# Patient Record
Sex: Female | Born: 1975 | Race: Black or African American | Hispanic: No | Marital: Single | Smoking: Never smoker
Health system: Southern US, Community
[De-identification: ages and names within clinical notes are randomized; demographics above are authoritative.]

---

## 2000-06-26 ENCOUNTER — Emergency Department (HOSPITAL_COMMUNITY): Admission: EM | Admit: 2000-06-26 | Discharge: 2000-06-26 | Payer: Self-pay | Admitting: Emergency Medicine

## 2000-11-21 ENCOUNTER — Emergency Department (HOSPITAL_COMMUNITY): Admission: EM | Admit: 2000-11-21 | Discharge: 2000-11-21 | Payer: Self-pay | Admitting: Emergency Medicine

## 2000-11-28 ENCOUNTER — Emergency Department (HOSPITAL_COMMUNITY): Admission: EM | Admit: 2000-11-28 | Discharge: 2000-11-28 | Payer: Self-pay | Admitting: Emergency Medicine

## 2003-06-03 ENCOUNTER — Other Ambulatory Visit: Admission: RE | Admit: 2003-06-03 | Discharge: 2003-06-03 | Payer: Self-pay | Admitting: Obstetrics and Gynecology

## 2003-10-02 ENCOUNTER — Emergency Department (HOSPITAL_COMMUNITY): Admission: EM | Admit: 2003-10-02 | Discharge: 2003-10-02 | Payer: Self-pay | Admitting: Emergency Medicine

## 2008-08-06 ENCOUNTER — Encounter (INDEPENDENT_AMBULATORY_CARE_PROVIDER_SITE_OTHER): Payer: Self-pay | Admitting: *Deleted

## 2008-10-08 ENCOUNTER — Ambulatory Visit: Payer: Self-pay | Admitting: Family Medicine

## 2008-10-08 DIAGNOSIS — L509 Urticaria, unspecified: Secondary | ICD-10-CM | POA: Insufficient documentation

## 2008-10-08 DIAGNOSIS — R631 Polydipsia: Secondary | ICD-10-CM

## 2008-10-08 LAB — CONVERTED CEMR LAB
Bilirubin Urine: NEGATIVE
Glucose, Urine, Semiquant: NEGATIVE
Ketones, urine, test strip: NEGATIVE
Nitrite: NEGATIVE
Protein, U semiquant: NEGATIVE
Specific Gravity, Urine: 1.015
Urobilinogen, UA: 0.2
WBC Urine, dipstick: NEGATIVE
pH: 6.5

## 2008-10-11 ENCOUNTER — Encounter: Payer: Self-pay | Admitting: Family Medicine

## 2008-10-11 LAB — CONVERTED CEMR LAB
ALT: 17 units/L (ref 0–35)
AST: 29 units/L (ref 0–37)
Albumin: 3.8 g/dL (ref 3.5–5.2)
Alkaline Phosphatase: 42 units/L (ref 39–117)
BUN: 15 mg/dL (ref 6–23)
Basophils Absolute: 0.1 10*3/uL (ref 0.0–0.1)
Basophils Relative: 1 % (ref 0.0–3.0)
Bilirubin, Direct: 0 mg/dL (ref 0.0–0.3)
CO2: 29 meq/L (ref 19–32)
Calcium: 9.2 mg/dL (ref 8.4–10.5)
Chloride: 107 meq/L (ref 96–112)
Cholesterol: 194 mg/dL (ref 0–200)
Creatinine, Ser: 0.8 mg/dL (ref 0.4–1.2)
Eosinophils Absolute: 0.2 10*3/uL (ref 0.0–0.7)
Eosinophils Relative: 3.2 % (ref 0.0–5.0)
GFR calc non Af Amer: 106.21 mL/min (ref >60)
Glucose, Bld: 92 mg/dL (ref 70–99)
HCT: 39.1 % (ref 36.0–46.0)
HDL: 59.2 mg/dL (ref >39.00)
Hemoglobin: 13.1 g/dL (ref 12.0–15.0)
Hgb A1c MFr Bld: 5 % (ref 4.6–6.5)
LDL Cholesterol: 128 mg/dL — ABNORMAL HIGH (ref 0–99)
Lymphocytes Relative: 35.3 % (ref 12.0–46.0)
Lymphs Abs: 2.1 10*3/uL (ref 0.7–4.0)
MCHC: 33.4 g/dL (ref 30.0–36.0)
MCV: 87.4 fL (ref 78.0–100.0)
Monocytes Absolute: 0.6 10*3/uL (ref 0.1–1.0)
Monocytes Relative: 9.6 % (ref 3.0–12.0)
Neutro Abs: 2.9 10*3/uL (ref 1.4–7.7)
Neutrophils Relative %: 50.9 % (ref 43.0–77.0)
Platelets: 257 10*3/uL (ref 150.0–400.0)
Potassium: 4.1 meq/L (ref 3.5–5.1)
RBC: 4.47 M/uL (ref 3.87–5.11)
RDW: 12.3 % (ref 11.5–14.6)
Sodium: 139 meq/L (ref 135–145)
TSH: 2.22 microintl units/mL (ref 0.35–5.50)
Total Bilirubin: 1.1 mg/dL (ref 0.3–1.2)
Total CHOL/HDL Ratio: 3
Total Protein: 7.6 g/dL (ref 6.0–8.3)
Triglycerides: 33 mg/dL (ref 0.0–149.0)
VLDL: 6.6 mg/dL (ref 0.0–40.0)
WBC: 5.9 10*3/uL (ref 4.5–10.5)

## 2008-10-15 ENCOUNTER — Encounter: Payer: Self-pay | Admitting: Family Medicine

## 2008-10-15 LAB — CONVERTED CEMR LAB: IgE (Immunoglobulin E), Serum: 47.8 intl units/mL (ref 0.0–180.0)

## 2011-09-14 ENCOUNTER — Emergency Department (HOSPITAL_COMMUNITY)
Admission: EM | Admit: 2011-09-14 | Discharge: 2011-09-15 | Disposition: A | Discharge status: Home / Self Care | Payer: 59 | Attending: Emergency Medicine | Admitting: Emergency Medicine

## 2011-09-14 ENCOUNTER — Encounter (HOSPITAL_COMMUNITY): Payer: Self-pay | Admitting: *Deleted

## 2011-09-14 DIAGNOSIS — Z882 Allergy status to sulfonamides status: Secondary | ICD-10-CM | POA: Insufficient documentation

## 2011-09-14 DIAGNOSIS — I10 Essential (primary) hypertension: Secondary | ICD-10-CM | POA: Insufficient documentation

## 2011-09-14 LAB — URINALYSIS, ROUTINE W REFLEX MICROSCOPIC
Bilirubin Urine: NEGATIVE
Glucose, UA: NEGATIVE mg/dL
Hgb urine dipstick: NEGATIVE
Ketones, ur: NEGATIVE mg/dL
Leukocytes, UA: NEGATIVE
Nitrite: NEGATIVE
Protein, ur: NEGATIVE mg/dL
Specific Gravity, Urine: 1.014 (ref 1.005–1.030)
Urobilinogen, UA: 1 mg/dL (ref 0.0–1.0)
pH: 6 (ref 5.0–8.0)

## 2011-09-14 LAB — PREGNANCY, URINE: Preg Test, Ur: NEGATIVE

## 2011-09-14 NOTE — ED Notes (Signed)
The pt reports that she was seen and worked up for leg pain at [facility] one weeks ago for leg pain and was given a lovenox shot  And had doppler studies.  Neg for dvt.

## 2011-09-14 NOTE — ED Notes (Signed)
The pt is c/o high bp and tingling in her arms for one week.  She is on no bp meds

## 2011-09-15 LAB — POCT I-STAT, CHEM 8
BUN: 13 mg/dL (ref 6–23)
Chloride: 105 mEq/L (ref 96–112)
Creatinine, Ser: 1 mg/dL (ref 0.50–1.10)
Potassium: 3.6 mEq/L (ref 3.5–5.1)
Sodium: 140 mEq/L (ref 135–145)
TCO2: 22 mmol/L (ref 0–100)

## 2011-09-15 MED ORDER — LISINOPRIL 20 MG PO TABS: 10.0000 mg | ORAL_TABLET | Freq: Every day | ORAL | Status: DC

## 2011-09-15 NOTE — ED Provider Notes (Addendum)
History     CSN: [id number]  Arrival date & time 09/14/11  2250   First MD Initiated Contact with Patient 09/15/11 0005      Chief Complaint  Patient presents with  . Hypertension    (Consider location/radiation/quality/duration/timing/severity/associated sxs/prior treatment) Patient is a 36 y.o. female presenting with hypertension. The history is provided by the patient.  Hypertension This is a new problem. The current episode started 2 days ago. The problem occurs constantly. The problem has not changed since onset.Associated symptoms comments: bilat arm tingling. Nothing relieves the symptoms. She has tried nothing for the symptoms.    History reviewed. No pertinent past medical history.  History reviewed. No pertinent past surgical history.  No family history on file.  History  Substance Use Topics  . Smoking status: Never Smoker   . Smokeless tobacco: Not on file  . Alcohol Use: Yes    OB History    Grav Para Term Preterm Abortions TAB SAB Ect Mult Living                  Review of Systems  Neurological: Positive for numbness.  All other systems reviewed and are negative.    Allergies  Sulfonamide derivatives  Home Medications  No current outpatient prescriptions on file.  BP 159/94  Pulse 76  Temp 98.8 F (37.1 C) (Oral)  Resp 21  SpO2 100%  LMP 08/24/2011  Physical Exam  Constitutional: She is oriented to person, place, and time. She appears well-developed and well-nourished.  HENT:  Head: Normocephalic and atraumatic.  Eyes: Conjunctivae and EOM are normal. Pupils are equal, round, and reactive to light.  Neck: Normal range of motion.  Cardiovascular: Normal rate, regular rhythm and normal heart sounds.   Pulmonary/Chest: Effort normal and breath sounds normal.  Abdominal: Soft. Bowel sounds are normal.  Musculoskeletal: Normal range of motion.  Neurological: She is alert and oriented to person, place, and time.  Skin: Skin is warm and  dry.  Psychiatric: She has a normal mood and affect. Her behavior is normal.    ED Course  Procedures (including critical care time)   Labs Reviewed  PREGNANCY, URINE  URINALYSIS, ROUTINE W REFLEX MICROSCOPIC   No results found.   No diagnosis found.    MDM  Mild hypertension, paraesthesia without focality.  No chest pain.  No headache. NV intact.  Will check chemistry,  Antihypertensive.    BP improved.  sxs resolved.  Will dc to [facility] out[facility],  Ret new/worsening sxs      Kaylamarie Swickard Lytle Michaels, MD 09/15/11 [id number]  Rosanne Ashing, MD 09/15/11 1191

## 2012-08-23 ENCOUNTER — Emergency Department (HOSPITAL_COMMUNITY)
Admission: EM | Admit: 2012-08-23 | Discharge: 2012-08-23 | Disposition: A | Discharge status: Home / Self Care | Payer: 59 | Attending: Emergency Medicine | Admitting: Emergency Medicine

## 2012-08-23 ENCOUNTER — Encounter (HOSPITAL_COMMUNITY): Payer: Self-pay | Admitting: *Deleted

## 2012-08-23 DIAGNOSIS — M2550 Pain in unspecified joint: Secondary | ICD-10-CM

## 2012-08-23 DIAGNOSIS — R21 Rash and other nonspecific skin eruption: Secondary | ICD-10-CM | POA: Insufficient documentation

## 2012-08-23 DIAGNOSIS — R609 Edema, unspecified: Secondary | ICD-10-CM | POA: Insufficient documentation

## 2012-08-23 DIAGNOSIS — L539 Erythematous condition, unspecified: Secondary | ICD-10-CM | POA: Insufficient documentation

## 2012-08-23 DIAGNOSIS — Z91040 Latex allergy status: Secondary | ICD-10-CM | POA: Insufficient documentation

## 2012-08-23 DIAGNOSIS — Z79899 Other long term (current) drug therapy: Secondary | ICD-10-CM | POA: Insufficient documentation

## 2012-08-23 LAB — CBC WITH DIFFERENTIAL/PLATELET
Eosinophils Absolute: 0.4 10*3/uL (ref 0.0–0.7)
Eosinophils Relative: 7 % — ABNORMAL HIGH (ref 0–5)
HCT: 40.6 % (ref 36.0–46.0)
Hemoglobin: 13.7 g/dL (ref 12.0–15.0)
Lymphocytes Relative: 33 % (ref 12–46)
Lymphs Abs: 1.9 10*3/uL (ref 0.7–4.0)
MCH: 28.7 pg (ref 26.0–34.0)
MCV: 85.1 fL (ref 78.0–100.0)
Monocytes Absolute: 0.6 10*3/uL (ref 0.1–1.0)
Monocytes Relative: 10 % (ref 3–12)
RBC: 4.77 MIL/uL (ref 3.87–5.11)
WBC: 5.9 10*3/uL (ref 4.0–10.5)

## 2012-08-23 LAB — POCT I-STAT, CHEM 8
BUN: 13 mg/dL (ref 6–23)
Calcium, Ion: 1.11 mmol/L — ABNORMAL LOW (ref 1.12–1.23)
Chloride: 106 mEq/L (ref 96–112)
Creatinine, Ser: 0.9 mg/dL (ref 0.50–1.10)
TCO2: 22 mmol/L (ref 0–100)

## 2012-08-23 MED ORDER — PREDNISONE 20 MG PO TABS: ORAL_TABLET | ORAL | Status: DC

## 2012-08-23 MED ORDER — SODIUM CHLORIDE 0.9 % IV BOLUS (SEPSIS)
500.0000 mL | Freq: Once | INTRAVENOUS | Status: AC
  Administered 2012-08-23: 500 mL via INTRAVENOUS

## 2012-08-23 MED ORDER — METHYLPREDNISOLONE SODIUM SUCC 125 MG IJ SOLR
125.0000 mg | Freq: Once | INTRAMUSCULAR | Status: AC
  Administered 2012-08-23: 125 mg via INTRAVENOUS
  Filled 2012-08-23: qty 2

## 2012-08-23 MED ORDER — DIPHENHYDRAMINE HCL 50 MG/ML IJ SOLN
25.0000 mg | Freq: Once | INTRAMUSCULAR | Status: AC
  Administered 2012-08-23: 25 mg via INTRAVENOUS
  Filled 2012-08-23: qty 1

## 2012-08-23 MED ORDER — FAMOTIDINE IN NACL 20-0.9 MG/50ML-% IV SOLN
20.0000 mg | Freq: Once | INTRAVENOUS | Status: AC
  Administered 2012-08-23: 20 mg via INTRAVENOUS
  Filled 2012-08-23: qty 50

## 2012-08-23 MED ORDER — KETOROLAC TROMETHAMINE 30 MG/ML IJ SOLN
30.0000 mg | Freq: Once | INTRAMUSCULAR | Status: AC
  Administered 2012-08-23: 30 mg via INTRAVENOUS
  Filled 2012-08-23: qty 1

## 2012-08-23 MED ORDER — NAPROXEN 500 MG PO TABS: 500.0000 mg | ORAL_TABLET | Freq: Two times a day (BID) | ORAL | Status: DC

## 2012-08-23 NOTE — ED Notes (Signed)
Reports waking up Thursday morning with joint pain to entire body, unable to lift arms up, having swelling to hands and feet and has rash to bilateral legs.

## 2012-08-23 NOTE — ED Provider Notes (Signed)
CSN: [id number]     Arrival date & time 08/23/12  1541 History     First MD Initiated Contact with Patient 08/23/12 1636     Chief Complaint  Patient presents with  . Joint Swelling   (Consider location/radiation/quality/duration/timing/severity/associated sxs/prior Treatment) HPI..... generalized joint swelling since Thursday morning with associated rash on legs and slight peripheral edema.    No fever, chills, palmar or plantar rash, meningeal signs, tic exposure.   Severity is mild to moderate. Nothing makes symptoms better or worse. This has never happened before. No family history of rheumatological disorders  History reviewed. No pertinent past medical history. History reviewed. No pertinent past surgical history. History reviewed. No pertinent family history. History  Substance Use Topics  . Smoking status: Never Smoker   . Smokeless tobacco: Not on file  . Alcohol Use: Yes   OB History   Grav Para Term Preterm Abortions TAB SAB Ect Mult Living                 Review of Systems  All other systems reviewed and are negative.    Allergies  Sulfonamide derivatives and Latex  Home Medications   Current Outpatient Rx  Name  Route  Sig  Dispense  Refill  . cetirizine (ZYRTEC) 10 MG tablet   Oral   Take 10 mg by mouth daily.         Marland Kitchen guaiFENesin (MUCINEX) 600 MG 12 hr tablet   Oral   Take 1,200 mg by mouth daily.         . valACYclovir (VALTREX) 1000 MG tablet   Oral   Take 1,000 mg by mouth daily.         . naproxen (NAPROSYN) 500 MG tablet   Oral   Take 1 tablet (500 mg total) by mouth 2 (two) times daily.   20 tablet   0   . predniSONE (DELTASONE) 20 MG tablet      3 tabs po day one, then 2 tabs daily x 4 days   11 tablet   0    BP 132/79  Pulse 86  Temp(Src) 99.8 F (37.7 C) (Oral)  Resp 18  SpO2 99%  LMP 08/15/2012 Physical Exam  Nursing note and vitals reviewed. Constitutional: She is oriented to person, place, and time. She appears  well-developed and well-nourished.  Alert, pleasant, no acute distress  HENT:  Head: Normocephalic and atraumatic.  Eyes: Conjunctivae and EOM are normal. Pupils are equal, round, and reactive to light.  Neck: Normal range of motion. Neck supple.  Cardiovascular: Normal rate, regular rhythm and normal heart sounds.   Pulmonary/Chest: Effort normal and breath sounds normal.  Abdominal: Soft. Bowel sounds are normal.  Musculoskeletal:  Full range of motion of shoulders, knees, ankles, wrists with associated pain.   Minimal peripheral edema.  Neurological: She is alert and oriented to person, place, and time.  Skin: Skin is warm and dry.  Erythematous papular rash on lower legs.  Psychiatric: She has a normal mood and affect.    ED Course   Procedures (including critical care time)  Labs Reviewed  CBC WITH DIFFERENTIAL - Abnormal; Notable for the following:    Eosinophils Relative 7 (*)    All other components within normal limits  SEDIMENTATION RATE  BASIC METABOLIC PANEL   No results found. 1. Joint ache     MDM  Uncertain etiology of joint pains.   IV site Medrol, Pepcid, Benadryl and ED.   Charge meds prednisone  and Naprosyn.   If symptoms persist, recommend followup with rheumatologist.  Discussed c Marinda Elk, MD 08/23/12 2015

## 2012-08-23 NOTE — ED Provider Notes (Signed)
Labs reviewed no abnormalities will DC home per Dr. Adriana Simas instructions   Arman Filter, NP 08/23/12 2055

## 2012-08-24 NOTE — ED Provider Notes (Signed)
Medical screening examination/treatment/procedure(s) were conducted as a shared visit with non-physician practitioner(s) and myself.  I personally evaluated the patient during the encounter.   See my note.   Dr Hessie Knows, MD 08/24/12 2102

## 2014-10-02 ENCOUNTER — Ambulatory Visit (INDEPENDENT_AMBULATORY_CARE_PROVIDER_SITE_OTHER): Payer: 59 | Admitting: Physician Assistant

## 2014-10-02 VITALS — BP 114/68 | HR 72 | Temp 98.7°F | Ht 66.0 in | Wt 225.4 lb

## 2014-10-02 DIAGNOSIS — H6121 Impacted cerumen, right ear: Secondary | ICD-10-CM

## 2014-10-02 NOTE — Patient Instructions (Signed)
Cerumen Impaction °A cerumen impaction is when the wax in your ear forms a plug. This plug usually causes reduced hearing. Sometimes it also causes an earache or dizziness. Removing a cerumen impaction can be difficult and painful. The wax sticks to the ear canal. The canal is sensitive and bleeds easily. If you try to remove a heavy wax buildup with a cotton tipped swab, you may push it in further. °Irrigation with water, suction, and small ear curettes may be used to clear out the wax. If the impaction is fixed to the skin in the ear canal, ear drops may be needed for a few days to loosen the wax. People who build up a lot of wax frequently can use ear wax removal products available in your local drugstore. °SEEK MEDICAL CARE IF:  °You develop an earache, increased hearing loss, or marked dizziness. °Document Released: 02/23/2004 Document Revised: 04/09/2011 Document Reviewed: 04/14/2009 °Exit[employer]® Patient Information ©2015 [employer]. This information is not intended to replace advice given to you by your health care provider. Make sure you discuss any questions you have with your health care provider. ° °

## 2014-10-06 NOTE — Progress Notes (Signed)
[facility] [address] [place] [address] [phone]  Date:  10/02/2014   Name:  Katherine Whitehead   DOB:  05/17/1975   MRN:  [id number]  PCP:  Almedia Balls, MD    History of Present Illness:  Katherine Whitehead is a 39 y.o. female patient who presents to [facility] for chief complaint of ear fullness and discomfort of the right eye.  She has no dizziness or drainage of the ear.  She has noticed decreased hearing       Patient Active Problem List   Diagnosis Date Noted  . 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 POLYDIPSIA 10/08/2008    No past medical history on file.  No past surgical history on file.  Social History  Substance Use Topics  . Smoking status: Never Smoker   . Smokeless tobacco: None  . Alcohol Use: Yes    No family history on file.  Allergies  Allergen Reactions  . Sulfonamide Derivatives Anaphylaxis  . Latex Rash    Medication list has been reviewed and updated.  No current outpatient prescriptions on file prior to visit.   No current facility-administered medications on file prior to visit.    ROS ROS otherwise unremarkable unless listed above.    Physical Examination: BP 114/68 mmHg  Pulse 72  Temp(Src) 98.7 F (37.1 C) (Oral)  Ht  (1.676 m)  Wt 225 lb 6.4 oz (102.241 kg)  BMI 36.40 kg/m2  SpO2 98%  LMP 09/17/2014 Ideal Body Weight: Weight in (lb) to have BMI = 25: 154.6  Physical Exam  Constitutional: She appears well-developed and well-nourished. No distress.  HENT:  Right Ear: External ear normal.  Left Ear: Tympanic membrane, external ear and ear canal normal.  Right ear ear canal with heavy cerumen impaction blocking visualization of the TM. Once irrigation, TM normal with mild injection likely secondary to irrigation.      Cardiovascular: Normal rate.   Skin: She is not diaphoretic.  Psychiatric: She has a normal mood and affect. Her speech is normal and behavior is normal.     Assessment and Plan: 39 year old  female is here today for chief complaint ear discomfort.  Cerumen impaction resolved, and patient reports normal ear function.    Cerumen impaction, right  Trena Platt, PA-C [facility] 10/06/2014 7:24 AM

## 2015-02-26 ENCOUNTER — Emergency Department (HOSPITAL_COMMUNITY)
Admission: EM | Admit: 2015-02-26 | Discharge: 2015-02-26 | Disposition: A | Discharge status: Home / Self Care | Payer: 59 | Attending: Emergency Medicine | Admitting: Emergency Medicine

## 2015-02-26 ENCOUNTER — Emergency Department (HOSPITAL_COMMUNITY): Payer: 59

## 2015-02-26 ENCOUNTER — Encounter (HOSPITAL_COMMUNITY): Payer: Self-pay | Admitting: Emergency Medicine

## 2015-02-26 DIAGNOSIS — Y9289 Other specified places as the place of occurrence of the external cause: Secondary | ICD-10-CM | POA: Diagnosis not present

## 2015-02-26 DIAGNOSIS — Y998 Other external cause status: Secondary | ICD-10-CM | POA: Diagnosis not present

## 2015-02-26 DIAGNOSIS — X58XXXA Exposure to other specified factors, initial encounter: Secondary | ICD-10-CM | POA: Diagnosis not present

## 2015-02-26 DIAGNOSIS — S6991XA Unspecified injury of right wrist, hand and finger(s), initial encounter: Secondary | ICD-10-CM | POA: Diagnosis present

## 2015-02-26 DIAGNOSIS — Y9389 Activity, other specified: Secondary | ICD-10-CM | POA: Diagnosis not present

## 2015-02-26 DIAGNOSIS — Z91040 Latex allergy status: Secondary | ICD-10-CM | POA: Diagnosis not present

## 2015-02-26 DIAGNOSIS — M79641 Pain in right hand: Secondary | ICD-10-CM

## 2015-02-26 DIAGNOSIS — M25531 Pain in right wrist: Secondary | ICD-10-CM

## 2015-02-26 MED ORDER — DICLOFENAC SODIUM 1 % TD GEL: 2.0000 g | Freq: Four times a day (QID) | TRANSDERMAL | Status: AC

## 2015-02-26 MED ORDER — IBUPROFEN 400 MG PO TABS
800.0000 mg | ORAL_TABLET | Freq: Once | ORAL | Status: AC
  Administered 2015-02-26: 800 mg via ORAL
  Filled 2015-02-26: qty 2

## 2015-02-26 MED ORDER — IBUPROFEN 800 MG PO TABS: 800.0000 mg | ORAL_TABLET | Freq: Three times a day (TID) | ORAL | Status: AC

## 2015-02-26 NOTE — ED Provider Notes (Signed)
CSN: [id number]     Arrival date & time 02/26/15  0003 History   First MD Initiated Contact with Patient 02/26/15 0011     Chief Complaint  Patient presents with  . Wrist Pain     (Consider location/radiation/quality/duration/timing/severity/associated sxs/prior Treatment) HPI   Katherine Whitehead is a 40 y.o. female, patient with no pertinent past medical history, presenting to the ED with right hand and wrist pain that began about 2 hours ago. Pt states she "popped" her wrist outward trying to stretch it and she had a sudden pain start in her wrist and hand. Pt rates her pain at 7/10, throbbing, nonradiating. Pt has not taken anything for her pain. Pt denies numbness, tingling, weakness, loss of function, or any other complaints or injuries.     History reviewed. No pertinent past medical history. History reviewed. No pertinent past surgical history. No family history on file. Social History  Substance Use Topics  . Smoking status: Never Smoker   . Smokeless tobacco: None  . Alcohol Use: Yes   OB History    No data available     Review of Systems  Musculoskeletal: Positive for arthralgias (Right wrist and hand pain).  Skin: Negative for color change and pallor.      Allergies  Sulfonamide derivatives and Latex  Home Medications   Prior to Admission medications   Medication Sig Start Date End Date Taking? Authorizing Provider  diclofenac sodium (VOLTAREN) 1 % GEL Apply 2 g topically 4 (four) times daily. 02/26/15   Laurella Tull C Corgan Mormile, PA-C  ibuprofen (ADVIL,MOTRIN) 800 MG tablet Take 1 tablet (800 mg total) by mouth 3 (three) times daily. 02/26/15   Nakoa Ganus C Tamakia Porto, PA-C   BP 167/87 mmHg  Pulse 79  Temp(Src) 97.5 F (36.4 C) (Oral)  Resp 18  SpO2 100%  LMP 02/10/2015 Physical Exam  Constitutional: She appears well-developed and well-nourished. No distress.  HENT:  Head: Normocephalic and atraumatic.  Eyes: Conjunctivae are normal.  Cardiovascular: Normal rate, regular  rhythm and intact distal pulses.   Pulmonary/Chest: Effort normal.  Musculoskeletal:  Range of motion in the right wrist limited by pain. Patient has full range of motion in all the fingers of the right hand. Tenderness to the dorsal aspect of the right wrist and metacarpals of the right hand. No erythema, gross swelling, or deformity noted.  Neurological: She is alert.  No sensory deficits. Strength 5 out of 5.  Skin: Skin is warm and dry. She is not diaphoretic.  Nursing note and vitals reviewed.   ED Course  Procedures (including critical care time) Labs Review Labs Reviewed - No data to display  Imaging Review Dg Hand Complete Right  02/26/2015  CLINICAL DATA:  R hand pain; PT states that her hand was tight, so she pulled at wrist and hand and it popped and then pain began after that X 9 pm 02/25/15 EXAM: RIGHT HAND - COMPLETE 3+ VIEW COMPARISON:  None. FINDINGS: There is no evidence of fracture or dislocation. There is no evidence of arthropathy or other focal bone abnormality. Soft tissues are unremarkable. IMPRESSION: Negative. Electronically Signed   By: Burman Nieves M.D.   On: 02/26/2015 01:22   I have personally reviewed and evaluated these images and lab results as part of my medical decision-making.   EKG Interpretation None      MDM   Final diagnoses:  Hand pain, right  Wrist pain, acute, right    Katherine Whitehead presents with right wrist and hand  pain that began just prior to arrival.  This patient shows no obvious signs of fracture or dislocation on exam. X-ray confirms this finding. Suspect that this patient has either a wrist sprain or other soft tissue injury. Patient was given instructions for home care as well as return precautions. Patient voiced understanding of these instructions.    Anselm Pancoast, PA-C 02/26/15 0128  Gilda Crease, MD 02/26/15 Jeralyn Bennett

## 2015-02-26 NOTE — ED Notes (Signed)
Pt st's her hand felt tight so she pulled on her wrist.  Pt c/o pain in her right wrist

## 2015-02-26 NOTE — Discharge Instructions (Signed)
You have been seen today for wrist and hand pain. Your imaging showed no abnormalities. Follow up with PCP as needed for reevaluation or chronic management of this issue. Return to ED should symptoms worsen. Apply ice for no more than 15 minutes at a time to reduce pain and inflammation. Use ibuprofen or naproxen to reduce pain and inflammation.

## 2017-01-19 IMAGING — DX DG HAND COMPLETE 3+V*R*
3 series · 3 of 3 positions shown · non-contrast
Comparison: None.

CLINICAL DATA: R hand pain; PT states that her hand was tight, so
she pulled at wrist and hand and it popped and then pain began after
that X 9 pm 02/25/15

EXAM:
RIGHT HAND - COMPLETE 3+ VIEW

[hand pa]
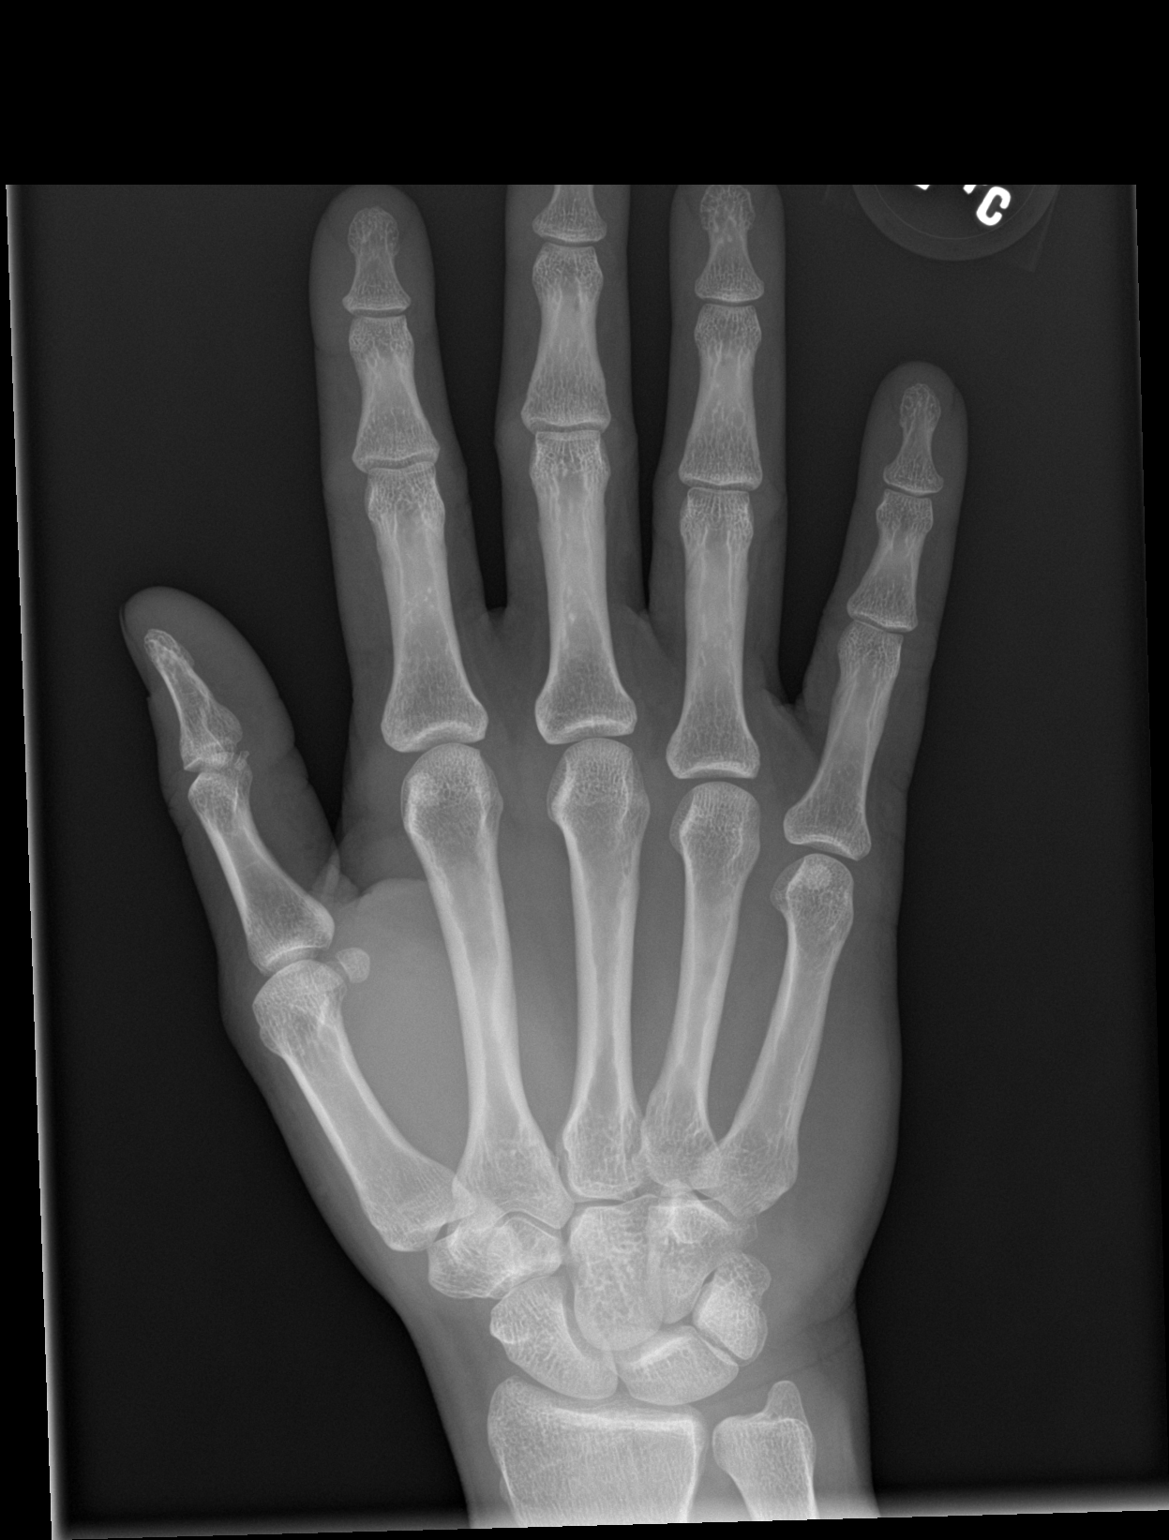

[hand obl]
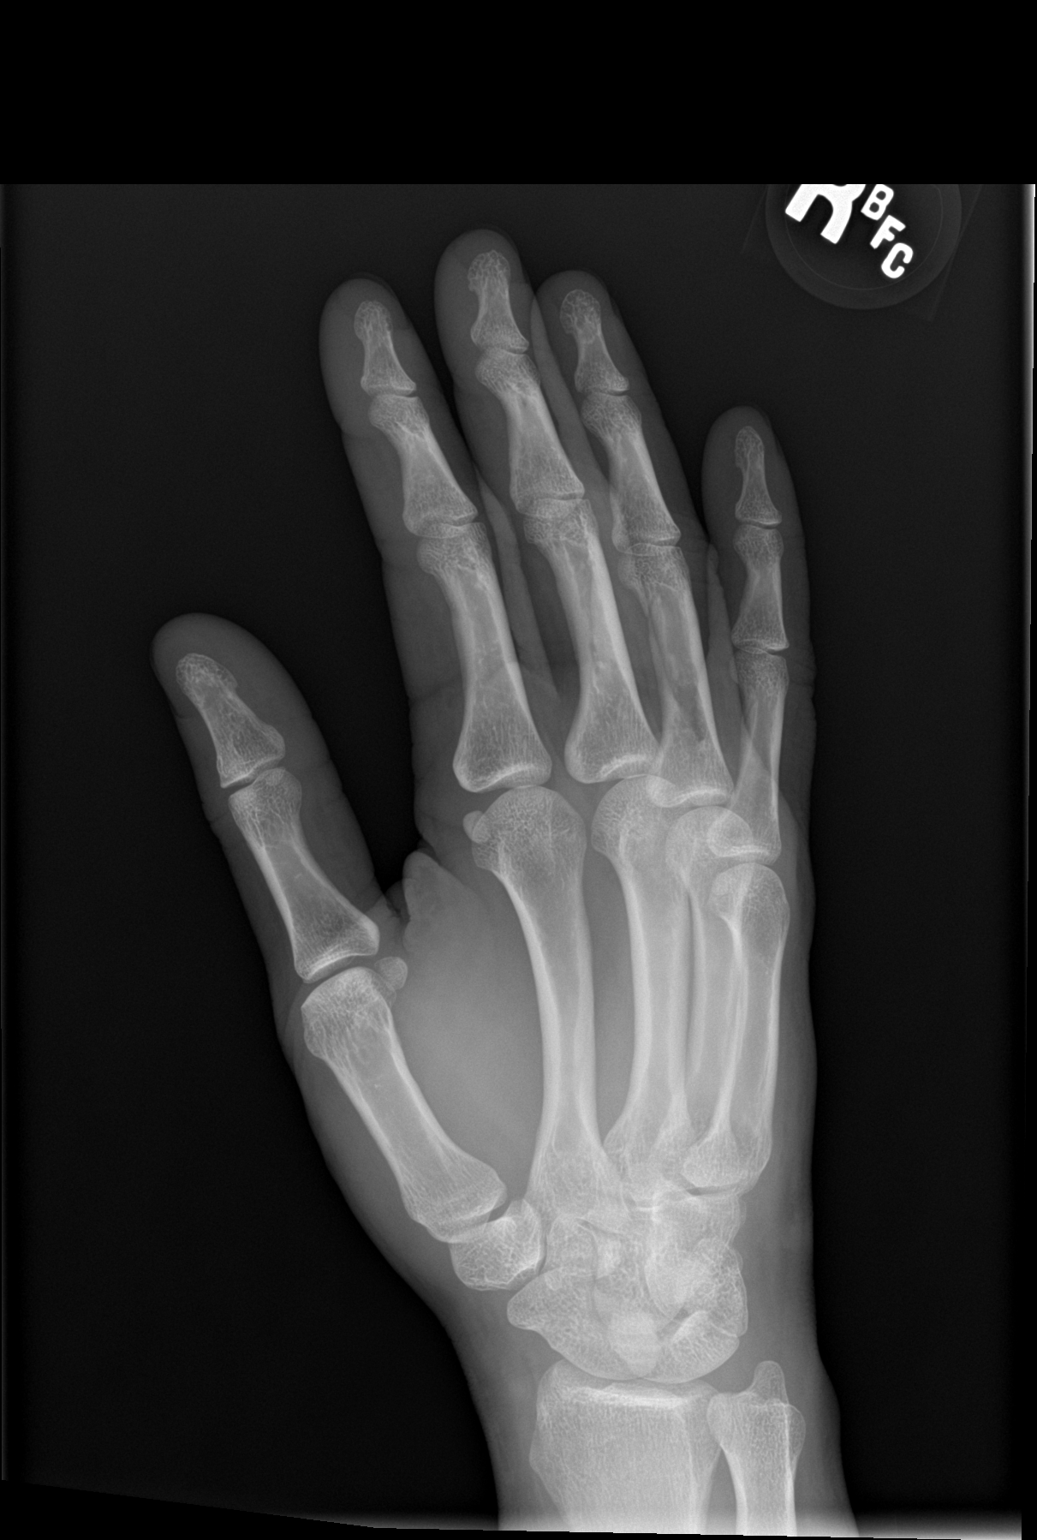

[hand lat]
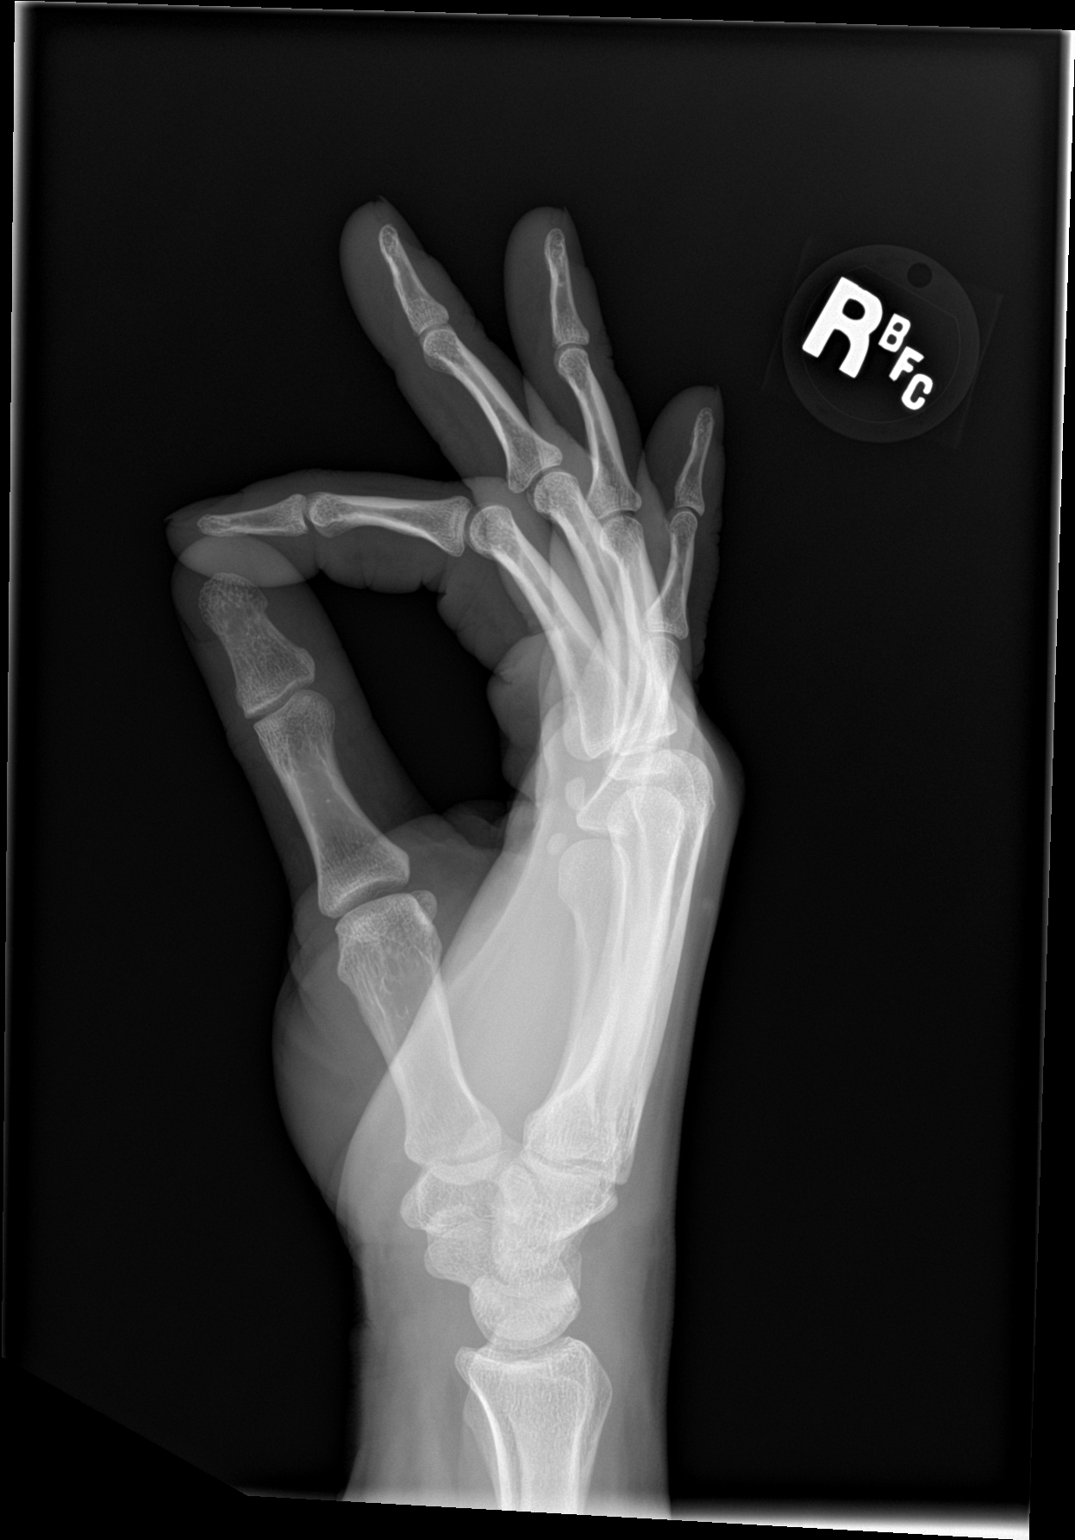

[3 of 3 positions shown; findings below may reference images not displayed]

FINDINGS: There is no evidence of fracture or dislocation. There is no
evidence of arthropathy or other focal bone abnormality. Soft
tissues are unremarkable.
IMPRESSION: Negative.
